# Patient Record
Sex: Male | Born: 1985 | Hispanic: No | Marital: Married | Smoking: Never smoker
Health system: Southern US, Community
[De-identification: ages and names within clinical notes are randomized; demographics above are authoritative.]

## PROBLEM LIST (undated history)

## (undated) DIAGNOSIS — G43909 Migraine, unspecified, not intractable, without status migrainosus: Secondary | ICD-10-CM

## (undated) HISTORY — PX: MOLE REMOVAL: SHX2046

## (undated) HISTORY — DX: Migraine, unspecified, not intractable, without status migrainosus: G43.909

---
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
## 2012-11-07 ENCOUNTER — Encounter: Payer: Self-pay | Admitting: Family Medicine

## 2012-11-07 ENCOUNTER — Ambulatory Visit (INDEPENDENT_AMBULATORY_CARE_PROVIDER_SITE_OTHER): Payer: 59 | Admitting: Family Medicine

## 2012-11-07 VITALS — BP 112/78 | HR 52 | Temp 98.4°F | Resp 16 | Ht 69.5 in | Wt 180.5 lb

## 2012-11-07 DIAGNOSIS — Z0000 Encounter for general adult medical examination without abnormal findings: Secondary | ICD-10-CM

## 2012-11-07 LAB — BASIC METABOLIC PANEL
BUN: 17 mg/dL (ref 6–23)
Calcium: 9.7 mg/dL (ref 8.4–10.5)
Creatinine, Ser: 1 mg/dL (ref 0.4–1.5)
GFR: 94.21 mL/min (ref >60.00)
Glucose, Bld: 98 mg/dL (ref 70–99)

## 2012-11-07 LAB — CBC WITH DIFFERENTIAL/PLATELET
Basophils Relative: 0.2 % (ref 0.0–3.0)
Eosinophils Relative: 3.6 % (ref 0.0–5.0)
HCT: 44.4 % (ref 39.0–52.0)
Hemoglobin: 14.9 g/dL (ref 13.0–17.0)
Lymphs Abs: 1.8 10*3/uL (ref 0.7–4.0)
MCV: 87.1 fl (ref 78.0–100.0)
Monocytes Absolute: 0.6 10*3/uL (ref 0.1–1.0)
Neutrophils Relative %: 58.3 % (ref 43.0–77.0)
RBC: 5.09 Mil/uL (ref 4.22–5.81)
WBC: 6.2 10*3/uL (ref 4.5–10.5)

## 2012-11-07 LAB — HEPATIC FUNCTION PANEL
ALT: 21 U/L (ref 0–53)
AST: 28 U/L (ref 0–37)
Total Bilirubin: 0.4 mg/dL (ref 0.3–1.2)

## 2012-11-07 LAB — POCT URINALYSIS DIPSTICK
Ketones, UA: NEGATIVE
Leukocytes, UA: NEGATIVE
Protein, UA: NEGATIVE
Spec Grav, UA: 1.01
pH, UA: 5.5

## 2012-11-07 NOTE — Progress Notes (Signed)
  Subjective:    Patient ID: Luke Gallagher, male    DOB: [birth year], 27 y.o.   MRN: [id number]  HPI Patient seen to establish care and for well visit No chronic medical problems. Takes no regular medications. Only previous surgery was for wisdom teeth extraction.  Family history significant for father dying of bladder cancer age 19. Grandfather with melanoma history. Father also had history of stroke.  Patient is married. Expecting first child in January of 2015. Nonsmoker. No alcohol use. Exercises regularly. Currently taking online Masters course. Works as a Geneticist, molecular.  No past medical history on file. Past Surgical History  Procedure Laterality Date  . Wisdom tooth extraction  2004  . Mole removal      reports that he has never smoked. He does not have any smokeless tobacco history on file. He reports that he does not drink alcohol or use illicit drugs. family history includes Bladder Cancer (age of onset: 32) in his father; Breast cancer in his paternal grandmother; Colon cancer in his maternal grandmother; Healthy in his brother and mother; Hyperlipidemia in his father; Hypertension in his father; Melanoma in his paternal grandfather; Other in his maternal grandfather; and Stroke in his father. No Known Allergies    Review of Systems  Constitutional: Negative for fever, activity change, appetite change and fatigue.  HENT: Negative for ear pain, congestion and trouble swallowing.   Eyes: Negative for pain and visual disturbance.  Respiratory: Negative for cough, shortness of breath and wheezing.   Cardiovascular: Negative for chest pain and palpitations.  Gastrointestinal: Negative for nausea, vomiting, abdominal pain, diarrhea, constipation, blood in stool, abdominal distention and rectal pain.  Genitourinary: Negative for dysuria, hematuria and testicular pain.  Musculoskeletal: Negative for joint swelling and arthralgias.  Skin: Negative for rash.   Neurological: Negative for dizziness, syncope and headaches.  Hematological: Negative for adenopathy.  Psychiatric/Behavioral: Negative for confusion and dysphoric mood.       Objective:   Physical Exam  Constitutional: He is oriented to person, place, and time. He appears well-developed and well-nourished. No distress.  HENT:  Head: Normocephalic and atraumatic.  Right Ear: External ear normal.  Left Ear: External ear normal.  Mouth/Throat: Oropharynx is clear and moist.  Eyes: Conjunctivae and EOM are normal. Pupils are equal, round, and reactive to light.  Neck: Normal range of motion. Neck supple. No thyromegaly present.  Cardiovascular: Normal rate, regular rhythm and normal heart sounds.   No murmur heard. Pulmonary/Chest: No respiratory distress. He has no wheezes. He has no rales.  Abdominal: Soft. Bowel sounds are normal. He exhibits no distension and no mass. There is no tenderness. There is no rebound and no guarding.  Musculoskeletal: He exhibits no edema.  Lymphadenopathy:    He has no cervical adenopathy.  Neurological: He is alert and oriented to person, place, and time. He displays normal reflexes. No cranial nerve deficit.  Skin: No rash noted.  Patient has some scattered nevi but these appear benign  Psychiatric: He has a normal mood and affect.          Assessment & Plan:  Healthy 27 year old male. Confirm date of last tetanus and recommend booster if this was done before 2006. Obtain screening lab work. Continue regular exercise habits.

## 2012-11-07 NOTE — Patient Instructions (Addendum)
Confirm date of last tetanus and consider Tdap if not received after 2006.

## 2013-05-03 ENCOUNTER — Telehealth: Payer: Self-pay | Admitting: Internal Medicine

## 2013-05-03 NOTE — Telephone Encounter (Signed)
Patient Information:  Caller Name: Revonda Standard  Phone: [phone]  Patient: Luke Gallagher, Luke Gallagher  Gender: Male  DOB: 1986/02/06  Age: 27 Years  PCP: Birdie Sons (Adults only)  Office Follow Up:  Does the office need to follow up with this patient?: No  Instructions For The Office: N/A   Symptoms  Reason For Call & Symptoms: Allsion wife calling for husband who had cold SX and she wanted to know how long he would be contagious.  I told her until he has not run a fever x 24 hrs without antipyretics.  Reviewed Health History In EMR: N/A  Reviewed Medications In EMR: N/A  Reviewed Allergies In EMR: N/A  Reviewed Surgeries / Procedures: N/A  Date of Onset of Symptoms: Unknown  Guideline(s) Used:  No Protocol Available - Information Only  Disposition Per Guideline:   Home Care  Reason For Disposition Reached:   Information only question and nurse able to answer  Advice Given:  N/A  Patient Will Follow Care Advice:  YES

## 2014-02-03 ENCOUNTER — Encounter: Payer: 59 | Admitting: Family Medicine

## 2014-02-26 ENCOUNTER — Encounter: Payer: Self-pay | Admitting: Family Medicine

## 2014-02-26 ENCOUNTER — Ambulatory Visit (INDEPENDENT_AMBULATORY_CARE_PROVIDER_SITE_OTHER): Payer: 59 | Admitting: Family Medicine

## 2014-02-26 VITALS — BP 122/74 | HR 111 | Temp 98.4°F | Wt 182.0 lb

## 2014-02-26 DIAGNOSIS — R05 Cough: Secondary | ICD-10-CM

## 2014-02-26 DIAGNOSIS — R059 Cough, unspecified: Secondary | ICD-10-CM

## 2014-02-26 LAB — CBC WITH DIFFERENTIAL/PLATELET
BASOS PCT: 0.1 % (ref 0.0–3.0)
Basophils Absolute: 0 10*3/uL (ref 0.0–0.1)
EOS PCT: 0.2 % (ref 0.0–5.0)
Eosinophils Absolute: 0 10*3/uL (ref 0.0–0.7)
HCT: 45.2 % (ref 39.0–52.0)
HEMOGLOBIN: 15 g/dL (ref 13.0–17.0)
LYMPHS PCT: 9.2 % — AB (ref 12.0–46.0)
Lymphs Abs: 0.7 10*3/uL (ref 0.7–4.0)
MCHC: 33.2 g/dL (ref 30.0–36.0)
MCV: 85.2 fl (ref 78.0–100.0)
MONOS PCT: 6 % (ref 3.0–12.0)
Monocytes Absolute: 0.5 10*3/uL (ref 0.1–1.0)
NEUTROS ABS: 6.6 10*3/uL (ref 1.4–7.7)
NEUTROS PCT: 84.5 % — AB (ref 43.0–77.0)
Platelets: 268 10*3/uL (ref 150.0–400.0)
RBC: 5.3 Mil/uL (ref 4.22–5.81)
RDW: 13.2 % (ref 11.5–15.5)
WBC: 7.8 10*3/uL (ref 4.0–10.5)

## 2014-02-26 NOTE — Patient Instructions (Signed)
Follow up for any fever or increased shortness of breath. 

## 2014-02-26 NOTE — Progress Notes (Signed)
Pre visit review using our clinic review tool, if applicable. No additional management support is needed unless otherwise documented below in the visit note. 

## 2014-02-26 NOTE — Progress Notes (Signed)
   Subjective:    Patient ID: Luke Gallagher, male    DOB: [birth year], 28 y.o.   MRN: [id number]  HPI Patient seen for acute visit with fatigue and cough.  .  2 weeks ago he had typical head cold and seemed to be getting better until a few days ago. He had recurrent cough which is been mostly nonproductive. Does have some chills and night sweats but has not taken temperature. Has had some nasal congestion intermittent sore throat.  Decreased appetite. No nausea or vomiting. No dysuria. No skin rashes. Diffuse body aches  No past medical history on file. Past Surgical History  Procedure Laterality Date  . Wisdom tooth extraction  2004  . Mole removal      reports that he has never smoked. He does not have any smokeless tobacco history on file. He reports that he does not drink alcohol or use illicit drugs. family history includes Bladder Cancer (age of onset: 7353) in his father; Breast cancer in his paternal grandmother; Colon cancer in his maternal grandmother; Healthy in his brother and mother; Hyperlipidemia in his father; Hypertension in his father; Melanoma in his paternal grandfather; Other in his maternal grandfather; Stroke in his father. No Known Allergies    Review of Systems  Constitutional: Positive for chills and fatigue.  HENT: Positive for congestion and sore throat.   Respiratory: Positive for cough.   Gastrointestinal: Negative for abdominal pain.  Genitourinary: Negative for dysuria.  Skin: Negative for rash.  Hematological: Negative for adenopathy.       Objective:   Physical Exam  Constitutional: He appears well-developed and well-nourished.  HENT:  Right Ear: External ear normal.  Left Ear: External ear normal.  Mouth/Throat: Oropharynx is clear and moist.  Neck: Neck supple.  Cardiovascular: Normal rate and regular rhythm.   Pulmonary/Chest: Effort normal and breath sounds normal. No respiratory distress. He has no wheezes. He has no rales.  Musculoskeletal:  He exhibits no edema.  Lymphadenopathy:    He has no cervical adenopathy.  Skin: No rash noted.          Assessment & Plan:   probable viral syndrome. Patient had what sounds like a viral URI couple weeks ago. Nonfocal exam this time. Check temperature to document any fever. Check CBC. Consider chest x-ray if cough persist and fever documented, though nonfocal exam at this time

## 2014-03-03 ENCOUNTER — Encounter: Payer: 59 | Admitting: Family Medicine

## 2014-05-04 ENCOUNTER — Emergency Department (HOSPITAL_COMMUNITY)
Admission: EM | Admit: 2014-05-04 | Discharge: 2014-05-04 | Disposition: A | Discharge status: Home / Self Care | Payer: 59 | Source: Home / Self Care | Attending: Family Medicine | Admitting: Family Medicine

## 2014-05-04 ENCOUNTER — Encounter (HOSPITAL_COMMUNITY): Payer: Self-pay | Admitting: Emergency Medicine

## 2014-05-04 ENCOUNTER — Emergency Department (HOSPITAL_COMMUNITY)
Admission: EM | Admit: 2014-05-04 | Discharge: 2014-05-04 | Disposition: A | Discharge status: Home / Self Care | Payer: 59 | Attending: Emergency Medicine | Admitting: Emergency Medicine

## 2014-05-04 ENCOUNTER — Emergency Department (HOSPITAL_COMMUNITY): Payer: 59

## 2014-05-04 DIAGNOSIS — R51 Headache: Secondary | ICD-10-CM

## 2014-05-04 DIAGNOSIS — R519 Headache, unspecified: Secondary | ICD-10-CM

## 2014-05-04 DIAGNOSIS — R1110 Vomiting, unspecified: Secondary | ICD-10-CM | POA: Diagnosis not present

## 2014-05-04 DIAGNOSIS — R109 Unspecified abdominal pain: Secondary | ICD-10-CM | POA: Insufficient documentation

## 2014-05-04 DIAGNOSIS — Z79899 Other long term (current) drug therapy: Secondary | ICD-10-CM | POA: Insufficient documentation

## 2014-05-04 LAB — CBC WITH DIFFERENTIAL/PLATELET
Basophils Absolute: 0 10*3/uL (ref 0.0–0.1)
Basophils Relative: 0 % (ref 0–1)
Eosinophils Absolute: 0 10*3/uL (ref 0.0–0.7)
Eosinophils Relative: 0 % (ref 0–5)
HCT: 44 % (ref 39.0–52.0)
Hemoglobin: 15.1 g/dL (ref 13.0–17.0)
LYMPHS PCT: 9 % — AB (ref 12–46)
Lymphs Abs: 1 10*3/uL (ref 0.7–4.0)
MCH: 28.5 pg (ref 26.0–34.0)
MCHC: 34.3 g/dL (ref 30.0–36.0)
MCV: 83.2 fL (ref 78.0–100.0)
Monocytes Absolute: 0.6 10*3/uL (ref 0.1–1.0)
Monocytes Relative: 5 % (ref 3–12)
NEUTROS ABS: 9.5 10*3/uL — AB (ref 1.7–7.7)
NEUTROS PCT: 86 % — AB (ref 43–77)
Platelets: 279 10*3/uL (ref 150–400)
RBC: 5.29 MIL/uL (ref 4.22–5.81)
RDW: 12.8 % (ref 11.5–15.5)
WBC: 11.1 10*3/uL — AB (ref 4.0–10.5)

## 2014-05-04 LAB — COMPREHENSIVE METABOLIC PANEL
ALT: 18 U/L (ref 0–53)
AST: 22 U/L (ref 0–37)
Albumin: 4.7 g/dL (ref 3.5–5.2)
Alkaline Phosphatase: 75 U/L (ref 39–117)
Anion gap: 16 — ABNORMAL HIGH (ref 5–15)
BILIRUBIN TOTAL: 0.5 mg/dL (ref 0.3–1.2)
BUN: 14 mg/dL (ref 6–23)
CHLORIDE: 99 meq/L (ref 96–112)
CO2: 25 meq/L (ref 19–32)
Calcium: 9.7 mg/dL (ref 8.4–10.5)
Creatinine, Ser: 0.89 mg/dL (ref 0.50–1.35)
GFR calc Af Amer: >90 mL/min (ref >90)
Glucose, Bld: 106 mg/dL — ABNORMAL HIGH (ref 70–99)
Potassium: 4.3 mEq/L (ref 3.7–5.3)
SODIUM: 140 meq/L (ref 137–147)
Total Protein: 8.1 g/dL (ref 6.0–8.3)

## 2014-05-04 MED ORDER — KETOROLAC TROMETHAMINE 30 MG/ML IJ SOLN
30.0000 mg | Freq: Once | INTRAMUSCULAR | Status: AC
  Administered 2014-05-04: 30 mg via INTRAVENOUS
  Filled 2014-05-04: qty 1

## 2014-05-04 MED ORDER — PROMETHAZINE HCL 25 MG PO TABS: 25.0000 mg | ORAL_TABLET | Freq: Four times a day (QID) | ORAL | Status: DC | PRN

## 2014-05-04 MED ORDER — ONDANSETRON HCL 4 MG/2ML IJ SOLN
4.0000 mg | Freq: Once | INTRAMUSCULAR | Status: DC
  Filled 2014-05-04: qty 2

## 2014-05-04 MED ORDER — ONDANSETRON HCL 4 MG/2ML IJ SOLN
4.0000 mg | Freq: Once | INTRAMUSCULAR | Status: AC
  Administered 2014-05-04: 4 mg via INTRAVENOUS

## 2014-05-04 MED ORDER — SODIUM CHLORIDE 0.9 % IV SOLN
Freq: Once | INTRAVENOUS | Status: AC
  Administered 2014-05-04: 15:00:00 via INTRAVENOUS

## 2014-05-04 MED ORDER — HYDROMORPHONE HCL 1 MG/ML IJ SOLN
1.0000 mg | Freq: Once | INTRAMUSCULAR | Status: AC
Start: 2014-05-04 — End: 2014-05-04
  Administered 2014-05-04: 1 mg via INTRAVENOUS
  Filled 2014-05-04: qty 1

## 2014-05-04 MED ORDER — SODIUM CHLORIDE 0.9 % IV SOLN
Freq: Once | INTRAVENOUS | Status: AC
Start: 2014-05-04 — End: 2014-05-04
  Administered 2014-05-04: 11:00:00 via INTRAVENOUS

## 2014-05-04 MED ORDER — HYDROCODONE-ACETAMINOPHEN 5-325 MG PO TABS: 1.0000 | ORAL_TABLET | ORAL | Status: DC | PRN

## 2014-05-04 NOTE — ED Provider Notes (Signed)
CSN: [id number]     Arrival date & time 05/04/14  [id number] History   First MD Initiated Contact with Patient 05/04/14 [phone]     Chief Complaint  Patient presents with  . Headache   (Consider location/radiation/quality/duration/timing/severity/associated sxs/prior Treatment) Patient is a 28 y.o. male presenting with headaches. The history is provided by the patient.  Headache Pain location:  Generalized Quality:  Sharp Radiates to:  Does not radiate Onset quality:  Sudden Duration:  2 days Progression:  Worsening Chronicity:  New Similar to prior headaches: no   Worsened by:  Nothing tried Ineffective treatments:  Acetaminophen Associated symptoms: nausea   Associated symptoms: no back pain, no blurred vision, no congestion, no diarrhea, no dizziness, no ear pain, no pain, no neck pain, no neck stiffness, no numbness, no photophobia, no sinus pressure, no sore throat and no vomiting     History reviewed. No pertinent past medical history. Past Surgical History  Procedure Laterality Date  . Wisdom tooth extraction  2004  . Mole removal     Family History  Problem Relation Age of Onset  . Bladder Cancer Father 5353    Deceased  . Hyperlipidemia Father   . Hypertension Father   . Stroke Father   . Healthy Mother   . Colon cancer Maternal Grandmother   . Breast cancer Paternal Grandmother   . Melanoma Paternal Grandfather   . Other Maternal Grandfather     Natural causes-Age  . Healthy Brother    History  Substance Use Topics  . Smoking status: Never Smoker   . Smokeless tobacco: Not on file  . Alcohol Use: No    Review of Systems  Constitutional: Negative.   HENT: Negative for congestion, ear pain, sinus pressure and sore throat.   Eyes: Negative for blurred vision, photophobia and pain.  Gastrointestinal: Positive for nausea. Negative for vomiting and diarrhea.  Musculoskeletal: Negative for back pain, gait problem, neck pain and neck stiffness.  Neurological:  Positive for headaches. Negative for dizziness, weakness, light-headedness and numbness.    Allergies  Review of patient's allergies indicates no known allergies.  Home Medications   Prior to Admission medications   Medication Sig Start Date End Date Taking? Authorizing Provider  fish oil-omega-3 fatty acids 1000 MG capsule Take 2 g by mouth daily.    Historical Provider, MD  Multiple Vitamin (MULTIVITAMIN) tablet Take 1 tablet by mouth daily.    Historical Provider, MD   There were no vitals taken for this visit. Physical Exam  Constitutional: He is oriented to person, place, and time. He appears well-developed and well-nourished.  HENT:  Head: Normocephalic.  Right Ear: External ear normal.  Left Ear: External ear normal.  Mouth/Throat: Oropharynx is clear and moist.  Eyes: Pupils are equal, round, and reactive to light.  Neck: Normal range of motion. Neck supple.  Cardiovascular: Regular rhythm and normal heart sounds.   Pulmonary/Chest: Effort normal and breath sounds normal.  Abdominal: Soft. Bowel sounds are normal. He exhibits no distension. There is no tenderness.  Lymphadenopathy:    He has no cervical adenopathy.  Neurological: He is alert and oriented to person, place, and time. No cranial nerve deficit. Coordination normal.  Skin: Skin is warm and dry.  Nursing note and vitals reviewed.   ED Course  Procedures (including critical care time) Labs Review Labs Reviewed - No data to display  Imaging Review No results found.   MDM   1. Headache disorder   sent for further eval of  worst ever ha, no h/o ha, no fever, photophobia, sl nausea, mod stress, unable to sleep b/o ha.    Linna HoffJames D Radonna Bracher, MD 05/04/14 1003

## 2014-05-04 NOTE — ED Notes (Signed)
I gave the patient a cup of ice water and a container of apple juice per nurse Marcelino DusterMichelle.

## 2014-05-04 NOTE — ED Notes (Signed)
C/o  Severe headache.  On set Saturday morning.  States "nausea started early this a.m."   No relief with otc meds.    Pain is felt at the back and top of head.   Also pain behind the eyes.   Denies any other symptoms.

## 2014-05-04 NOTE — ED Notes (Signed)
Patient transported to CT 

## 2014-05-04 NOTE — ED Provider Notes (Signed)
CSN: [id number]     Arrival date & time 05/04/14  1009 History   First MD Initiated Contact with Patient 05/04/14 1037     No chief complaint on file.    (Consider location/radiation/quality/duration/timing/severity/associated sxs/prior Treatment) Patient is a 28 y.o. male presenting with headaches. The history is provided by the patient. No language interpreter was used.  Headache Pain location:  Generalized Radiates to:  Does not radiate Severity currently:  10/10 Severity at highest:  10/10 Onset quality:  Sudden Duration:  1 day Timing:  Constant Progression:  Worsening Chronicity:  New Similar to prior headaches: no   Context: not straining   Relieved by:  Nothing Worsened by:  Nothing tried Ineffective treatments:  None tried Associated symptoms: abdominal pain and vomiting   Associated symptoms: no numbness   Risk factors: no anger and does not have insomnia     History reviewed. No pertinent past medical history. Past Surgical History  Procedure Laterality Date  . Wisdom tooth extraction  2004  . Mole removal     Family History  Problem Relation Age of Onset  . Bladder Cancer Father 2653    Deceased  . Hyperlipidemia Father   . Hypertension Father   . Stroke Father   . Healthy Mother   . Colon cancer Maternal Grandmother   . Breast cancer Paternal Grandmother   . Melanoma Paternal Grandfather   . Other Maternal Grandfather     Natural causes-Age  . Healthy Brother    History  Substance Use Topics  . Smoking status: Never Smoker   . Smokeless tobacco: Not on file  . Alcohol Use: No    Review of Systems  Gastrointestinal: Positive for vomiting and abdominal pain.  Neurological: Positive for headaches. Negative for numbness.  All other systems reviewed and are negative.     Allergies  Review of patient's allergies indicates no known allergies.  Home Medications   Prior to Admission medications   Medication Sig Start Date End Date Taking?  Authorizing Provider  aspirin-acetaminophen-caffeine (EXCEDRIN MIGRAINE) [phone] MG per tablet Take 2 tablets by mouth every 6 (six) hours as needed for headache.   Yes Historical Provider, MD  butalbital-acetaminophen-caffeine (FIORICET, ESGIC) 50-325-40 MG per tablet Take 2 tablets by mouth 2 (two) times daily as needed for headache or migraine.   Yes Historical Provider, MD  fish oil-omega-3 fatty acids 1000 MG capsule Take 2 g by mouth daily.   Yes Historical Provider, MD  Multiple Vitamin (MULTIVITAMIN) tablet Take 1 tablet by mouth daily.   Yes Historical Provider, MD   BP 117/63 mmHg  Pulse 67  Temp(Src) 98.2 F (36.8 C) (Oral)  Resp 11  SpO2 100% Physical Exam  Constitutional: He is oriented to person, place, and time. He appears well-developed and well-nourished.  HENT:  Head: Normocephalic and atraumatic.  Right Ear: External ear normal.  Nose: Nose normal.  Mouth/Throat: Oropharynx is clear and moist.  Eyes: Conjunctivae and EOM are normal. Pupils are equal, round, and reactive to light.  Neck: Normal range of motion.  Cardiovascular: Normal rate and normal heart sounds.   Pulmonary/Chest: Effort normal.  Abdominal: Soft. He exhibits no distension.  Musculoskeletal: Normal range of motion.  Neurological: He is alert and oriented to person, place, and time.  Skin: Skin is warm.  Psychiatric: He has a normal mood and affect.  Nursing note and vitals reviewed.   ED Course  Procedures (including critical care time) Labs Review Labs Reviewed - No data to display  Imaging Review No results found.   EKG Interpretation None      Results for orders placed or performed during the hospital encounter of 05/04/14  CBC with Differential  Result Value Ref Range   WBC 11.1 (H) 4.0 - 10.5 K/uL   RBC 5.29 4.22 - 5.81 MIL/uL   Hemoglobin 15.1 13.0 - 17.0 g/dL   HCT 40.944.0 81.139.0 - 91.452.0 %   MCV 83.2 78.0 - 100.0 fL   MCH 28.5 26.0 - 34.0 pg   MCHC 34.3 30.0 - 36.0 g/dL    RDW 78.212.8 95.611.5 - 21.315.5 %   Platelets 279 150 - 400 K/uL   Neutrophils Relative % 86 (H) 43 - 77 %   Neutro Abs 9.5 (H) 1.7 - 7.7 K/uL   Lymphocytes Relative 9 (L) 12 - 46 %   Lymphs Abs 1.0 0.7 - 4.0 K/uL   Monocytes Relative 5 3 - 12 %   Monocytes Absolute 0.6 0.1 - 1.0 K/uL   Eosinophils Relative 0 0 - 5 %   Eosinophils Absolute 0.0 0.0 - 0.7 K/uL   Basophils Relative 0 0 - 1 %   Basophils Absolute 0.0 0.0 - 0.1 K/uL  Comprehensive metabolic panel  Result Value Ref Range   Sodium 140 137 - 147 mEq/L   Potassium 4.3 3.7 - 5.3 mEq/L   Chloride 99 96 - 112 mEq/L   CO2 25 19 - 32 mEq/L   Glucose, Bld 106 (H) 70 - 99 mg/dL   BUN 14 6 - 23 mg/dL   Creatinine, Ser 0.860.89 0.50 - 1.35 mg/dL   Calcium 9.7 8.4 - 57.810.5 mg/dL   Total Protein 8.1 6.0 - 8.3 g/dL   Albumin 4.7 3.5 - 5.2 g/dL   AST 22 0 - 37 U/L   ALT 18 0 - 53 U/L   Alkaline Phosphatase 75 39 - 117 U/L   Total Bilirubin 0.5 0.3 - 1.2 mg/dL   GFR calc non Af Amer >90 >90 mL/min   GFR calc Af Amer >90 >90 mL/min   Anion gap 16 (H) 5 - 15   Ct Head Wo Contrast  05/04/2014   CLINICAL DATA:  Patient with severe headache.  Nausea.  EXAM: CT HEAD WITHOUT CONTRAST  TECHNIQUE: Contiguous axial images were obtained from the base of the skull through the vertex without intravenous contrast.  COMPARISON:  None.  FINDINGS: Ventricles and sulci are appropriate for patient's age. No evidence for acute cortically based infarct, intracranial hemorrhage, mass lesion or mass-effect. The orbits are unremarkable. Paranasal sinuses are well aerated. Mastoid air cells are unremarkable. Calvarium is intact.  IMPRESSION: No acute intracranial process.   Electronically Signed   By: Annia Beltrew  Davis M.D.   On: 05/04/2014 14:17     MDM  Pt given iv fluids,  zofran and dilaudidx 1.   Ct scan normal.   Pt given torodol and zofran.  Pt feels better   Final diagnoses:  Head ache    Hydrocodone Phenergan     Elson AreasLeslie K Sofia, PA-C 05/04/14 1543  Rolland PorterMark  James, MD 05/05/14 2342

## 2014-05-04 NOTE — Discharge Instructions (Signed)

## 2014-05-04 NOTE — ED Notes (Signed)
Unbearable headache since yesterday morning. Entire head is experiencing pressure and throbbing. Throbbing and pressure increase with position changes such as bending over. Nausea this morning, no vomiting. Sensitive to sound, not light. Denies weakness in extremities, vision changes or changes in balance. Mild lightheaded feeling. Tried Exedrin, Baristaiorocet (given by a family member), and Nyquil without any relief.

## 2014-05-04 NOTE — ED Notes (Signed)
Patient returned from CT

## 2014-12-18 ENCOUNTER — Encounter: Payer: Self-pay | Admitting: Family Medicine

## 2014-12-18 ENCOUNTER — Ambulatory Visit (INDEPENDENT_AMBULATORY_CARE_PROVIDER_SITE_OTHER): Payer: Commercial Managed Care - HMO | Admitting: Family Medicine

## 2014-12-18 VITALS — BP 110/62 | HR 60 | Temp 98.2°F | Wt 180.0 lb

## 2014-12-18 DIAGNOSIS — J029 Acute pharyngitis, unspecified: Secondary | ICD-10-CM | POA: Diagnosis not present

## 2014-12-18 LAB — POCT RAPID STREP A (OFFICE): RAPID STREP A SCREEN: NEGATIVE

## 2014-12-18 NOTE — Progress Notes (Signed)
   Subjective:    Patient ID: Luke Gallagher, male    DOB: 06/02/1985, 29 y.o.   MRN: [id number]  HPI Acute visit for sore throat. Duration 4 days. Patient had some chills and body aches and symptoms are actually improved today. He has not had any nausea or vomiting. No headaches. He has not had any cough or significant nasal congestion. Denies any sick contacts.  No past medical history on file. Past Surgical History  Procedure Laterality Date  . Wisdom tooth extraction  2004  . Mole removal      reports that he has never smoked. He does not have any smokeless tobacco history on file. He reports that he does not drink alcohol or use illicit drugs. family history includes Bladder Cancer (age of onset: 59) in his father; Breast cancer in his paternal grandmother; Colon cancer in his maternal grandmother; Healthy in his brother and mother; Hyperlipidemia in his father; Hypertension in his father; Melanoma in his paternal grandfather; Other in his maternal grandfather; Stroke in his father. No Known Allergies    Review of Systems  Constitutional: Negative for fever (Not now but possibly a few days ago).  HENT: Positive for sore throat. Negative for congestion.   Respiratory: Negative for cough.   Neurological: Negative for headaches.       Objective:   Physical Exam  Constitutional: He appears well-developed and well-nourished.  HENT:  Right Ear: External ear normal.  Left Ear: External ear normal.  Only very minimal erythema posterior pharynx. No exudate  Neck: Neck supple.  Cardiovascular: Normal rate and regular rhythm.   Pulmonary/Chest: Effort normal and breath sounds normal. No respiratory distress. He has no wheezes. He has no rales.  Lymphadenopathy:    He has no cervical adenopathy.          Assessment & Plan:  Pharyngitis. Rapid strep negative. Suspect viral. Treat symptomatically. Follow-up when necessary

## 2014-12-18 NOTE — Patient Instructions (Signed)

## 2014-12-18 NOTE — Progress Notes (Signed)
Pre visit review using our clinic review tool, if applicable. No additional management support is needed unless otherwise documented below in the visit note. 

## 2015-03-23 ENCOUNTER — Ambulatory Visit: Payer: Commercial Managed Care - HMO | Admitting: Family Medicine

## 2015-03-23 ENCOUNTER — Encounter: Payer: Self-pay | Admitting: Internal Medicine

## 2015-03-23 ENCOUNTER — Ambulatory Visit (INDEPENDENT_AMBULATORY_CARE_PROVIDER_SITE_OTHER): Payer: Commercial Managed Care - HMO | Admitting: Internal Medicine

## 2015-03-23 VITALS — BP 124/72 | Temp 98.9°F | Wt 176.0 lb

## 2015-03-23 DIAGNOSIS — J0110 Acute frontal sinusitis, unspecified: Secondary | ICD-10-CM

## 2015-03-23 MED ORDER — AMOXICILLIN-POT CLAVULANATE 875-125 MG PO TABS: 1.0000 | ORAL_TABLET | Freq: Two times a day (BID) | ORAL | Status: DC

## 2015-03-23 NOTE — Patient Instructions (Signed)
YOu have  Sinusitis probable bacterial complicaiton of  A viral respiratory infection . Antibiotic and continue  Saline spray and decongestant warm compresses. Expect improvement in the next 3-4 days  Contact us if not getting bette r   Sinusitis, Adult Sinusitis is redness, soreness, and inflammation of the paranasal sinuses. Paranasal sinuses are air pockets within the bones of your face. They are located beneath your eyes, in the middle of your forehead, and above your eyes. In healthy paranasal sinuses, mucus is able to drain out, and air is able to circulate through them by way of your nose. However, when your paranasal sinuses are inflamed, mucus and air can become trapped. This can allow bacteria and other germs to grow and cause infection. Sinusitis can develop quickly and last only a short time (acute) or continue over a long period (chronic). Sinusitis that lasts for more than 12 weeks is considered chronic. CAUSES Causes of sinusitis include:  Allergies.  Structural abnormalities, such as displacement of the cartilage that separates your nostrils (deviated septum), which can decrease the air flow through your nose and sinuses and affect sinus drainage.  Functional abnormalities, such as when the small hairs (cilia) that line your sinuses and help remove mucus do not work properly or are not present. SIGNS AND SYMPTOMS Symptoms of acute and chronic sinusitis are the same. The primary symptoms are pain and pressure around the affected sinuses. Other symptoms include:  Upper toothache.  Earache.  Headache.  Bad breath.  Decreased sense of smell and taste.  A cough, which worsens when you are lying flat.  Fatigue.  Fever.  Thick drainage from your nose, which often is green and may contain pus (purulent).  Swelling and warmth over the affected sinuses. DIAGNOSIS Your health care provider will perform a physical exam. During your exam, your health care provider may  perform any of the following to help determine if you have acute sinusitis or chronic sinusitis:  Look in your nose for signs of abnormal growths in your nostrils (nasal polyps).  Tap over the affected sinus to check for signs of infection.  View the inside of your sinuses using an imaging device that has a light attached (endoscope). If your health care provider suspects that you have chronic sinusitis, one or more of the following tests may be recommended:  Allergy tests.  Nasal culture. A sample of mucus is taken from your nose, sent to a lab, and screened for bacteria.  Nasal cytology. A sample of mucus is taken from your nose and examined by your health care provider to determine if your sinusitis is related to an allergy. TREATMENT Most cases of acute sinusitis are related to a viral infection and will resolve on their own within 10 days. Sometimes, medicines are prescribed to help relieve symptoms of both acute and chronic sinusitis. These may include pain medicines, decongestants, nasal steroid sprays, or saline sprays. However, for sinusitis related to a bacterial infection, your health care provider will prescribe antibiotic medicines. These are medicines that will help kill the bacteria causing the infection. Rarely, sinusitis is caused by a fungal infection. In these cases, your health care provider will prescribe antifungal medicine. For some cases of chronic sinusitis, surgery is needed. Generally, these are cases in which sinusitis recurs more than 3 times per year, despite other treatments. HOME CARE INSTRUCTIONS  Drink plenty of water. Water helps thin the mucus so your sinuses can drain more easily.  Use a humidifier.  Inhale steam  3-4 times a day (for example, sit in the bathroom with the shower running).  Apply a warm, moist washcloth to your face 3-4 times a day, or as directed by your health care provider.  Use saline nasal sprays to help moisten and clean your  sinuses.  Take medicines only as directed by your health care provider.  If you were prescribed either an antibiotic or antifungal medicine, finish it all even if you start to feel better. SEEK IMMEDIATE MEDICAL CARE IF:  You have increasing pain or severe headaches.  You have nausea, vomiting, or drowsiness.  You have swelling around your face.  You have vision problems.  You have a stiff neck.  You have difficulty breathing.   This information is not intended to replace advice given to you by your health care provider. Make sure you discuss any questions you have with your health care provider.   Document Released: 05/02/2005 Document Revised: 05/23/2014 Document Reviewed: 05/17/2011 Elsevier Interactive Patient Education [employer].

## 2015-03-23 NOTE — Progress Notes (Signed)
Pre visit review using our clinic review tool, if applicable. No additional management support is needed unless otherwise documented below in the visit note.  Chief Complaint  Patient presents with  . Fever  . Sore Throat  . Headache  . Cough  . Sinus Pressure/Pain    HPI: Patient Luke Gallagher  comes in today for SDA for  new problem evaluation. PCP is dr Caryl Never generally healthy   Onset a lmost a week ago  Onset runny nose and st .  nyquil .  Worse this weekend.   Now has had Fever. 3 days low grade 100 range .   Sweats  And bad pressure face   Ethmoid  Frontal   Area  Worse with  and  Saline. r tender   Frontal  Area .    Friday and jaw teeth cheek bone hurt .  Worse with bending. Over and when coughing   Cough ing up . Little bit yellow green .  Blowing nose  Yellow green.    ROS: See pertinent positives and negatives per HPI. No cp sob chills  Never had pain in face head like this in past . Using daquil niquil sudafed tec  No change  No vomiting diarrhea 29 yo at home had pink eye .  Wife expecting in march  No past medical history on file.  Family History  Problem Relation Age of Onset  . Bladder Cancer Father 17    Deceased  . Hyperlipidemia Father   . Hypertension Father   . Stroke Father   . Healthy Mother   . Colon cancer Maternal Grandmother   . Breast cancer Paternal Grandmother   . Melanoma Paternal Grandfather   . Other Maternal Grandfather     Natural causes-Age  . Healthy Brother     Social History   Social History  . Marital Status: Married    Spouse Name: N/A  . Number of Children: N/A  . Years of Education: N/A   Social History Main Topics  . Smoking status: Never Smoker   . Smokeless tobacco: None  . Alcohol Use: No  . Drug Use: No  . Sexual Activity: Yes   Other Topics Concern  . None   Social History Narrative    Outpatient Prescriptions Prior to Visit  Medication Sig Dispense Refill  . fish oil-omega-3 fatty acids 1000 MG  capsule Take 2 g by mouth daily.    . Multiple Vitamin (MULTIVITAMIN) tablet Take 1 tablet by mouth daily.    Marland Kitchen aspirin-acetaminophen-caffeine (EXCEDRIN MIGRAINE) 250-250-65 MG per tablet Take 2 tablets by mouth every 6 (six) hours as needed for headache.    . butalbital-acetaminophen-caffeine (FIORICET, ESGIC) 50-325-40 MG per tablet Take 2 tablets by mouth 2 (two) times daily as needed for headache or migraine.    . promethazine (PHENERGAN) 25 MG tablet Take 1 tablet (25 mg total) by mouth every 6 (six) hours as needed for nausea or vomiting. (Patient not taking: Reported on 03/23/2015) 20 tablet 0   No facility-administered medications prior to visit.     EXAM:  BP 124/72 mmHg  Temp(Src) 98.9 F (37.2 C) (Oral)  Wt 176 lb (79.833 kg)  Body mass index is 25.63 kg/(m^2).  GENERAL: vitals reviewed and listed above, alert, oriented, appears well hydrated and in no acute distress ild mod ill   Congested  HEENT: atraumatic, conjunctiva  clear, no obvious abnormalities on inspection of external nose and ears tms clear  Tenderness bilateral fontal ethmoid area  Mild on maxilla nares thick mucoid dc  OP : no lesion edema or exudate  NECK: no obvious masses on inspection palpation  LUNGS: clear to auscultation bilaterally, no wheezes, rales or rhonchi, good air movement CV: HRRR, no clubbing cyanosis or  peripheral edema nl cap refill  MS: moves all extremities without noticeable focal  Abnormality Skin: normal capillary refill ,turgor , color: No acute rashes ,petechiae or bruising PSYCH: pleasant and cooperative, no obvious depression or anxiety  ASSESSMENT AND PLAN:  Discussed the following assessment and plan:  Acute ? frontal sinusitis, recurrence not specified - sig sx unresponsive to decongestants and severity of sx  add antibiotic   Risk benefit of medication discussed.   Expectant management. Warm conpresses and add antibiotic as discussed  -Patient advised to return or notify  health care team  if symptoms worsen ,persist or new concerns arise.  Patient Instructions    YOu have  Sinusitis probable bacterial complicaiton of  A viral respiratory infection . Antibiotic and continue  Saline spray and decongestant warm compresses. Expect improvement in the next 3-4 days  Contact us if not getting bette r   Sinusitis, Adult Sinusitis is redness, soreness, and inflammation of the paranasal sinuses. Paranasal sinuses are air pockets within the bones of your face. They are located beneath your eyes, in the middle of your forehead, and above your eyes. In healthy paranasal sinuses, mucus is able to drain out, and air is able to circulate through them by way of your nose. However, when your paranasal sinuses are inflamed, mucus and air can become trapped. This can allow bacteria and other germs to grow and cause infection. Sinusitis can develop quickly and last only a short time (acute) or continue over a long period (chronic). Sinusitis that lasts for more than 12 weeks is considered chronic. CAUSES Causes of sinusitis include:  Allergies.  Structural abnormalities, such as displacement of the cartilage that separates your nostrils (deviated septum), which can decrease the air flow through your nose and sinuses and affect sinus drainage.  Functional abnormalities, such as when the small hairs (cilia) that line your sinuses and help remove mucus do not work properly or are not present. SIGNS AND SYMPTOMS Symptoms of acute and chronic sinusitis are the same. The primary symptoms are pain and pressure around the affected sinuses. Other symptoms include:  Upper toothache.  Earache.  Headache.  Bad breath.  Decreased sense of smell and taste.  A cough, which worsens when you are lying flat.  Fatigue.  Fever.  Thick drainage from your nose, which often is green and may contain pus (purulent).  Swelling and warmth over the affected sinuses. DIAGNOSIS Your  health care provider will perform a physical exam. During your exam, your health care provider may perform any of the following to help determine if you have acute sinusitis or chronic sinusitis:  Look in your nose for signs of abnormal growths in your nostrils (nasal polyps).  Tap over the affected sinus to check for signs of infection.  View the inside of your sinuses using an imaging device that has a light attached (endoscope). If your health care provider suspects that you have chronic sinusitis, one or more of the following tests may be recommended:  Allergy tests.  Nasal culture. A sample of mucus is taken from your nose, sent to a lab, and screened for bacteria.  Nasal cytology. A sample of mucus is taken from your nose and examined by your health care provider to  determine if your sinusitis is related to an allergy. TREATMENT Most cases of acute sinusitis are related to a viral infection and will resolve on their own within 10 days. Sometimes, medicines are prescribed to help relieve symptoms of both acute and chronic sinusitis. These may include pain medicines, decongestants, nasal steroid sprays, or saline sprays. However, for sinusitis related to a bacterial infection, your health care provider will prescribe antibiotic medicines. These are medicines that will help kill the bacteria causing the infection. Rarely, sinusitis is caused by a fungal infection. In these cases, your health care provider will prescribe antifungal medicine. For some cases of chronic sinusitis, surgery is needed. Generally, these are cases in which sinusitis recurs more than 3 times per year, despite other treatments. HOME CARE INSTRUCTIONS  Drink plenty of water. Water helps thin the mucus so your sinuses can drain more easily.  Use a humidifier.  Inhale steam 3-4 times a day (for example, sit in the bathroom with the shower running).  Apply a warm, moist washcloth to your face 3-4 times a day, or as  directed by your health care provider.  Use saline nasal sprays to help moisten and clean your sinuses.  Take medicines only as directed by your health care provider.  If you were prescribed either an antibiotic or antifungal medicine, finish it all even if you start to feel better. SEEK IMMEDIATE MEDICAL CARE IF:  You have increasing pain or severe headaches.  You have nausea, vomiting, or drowsiness.  You have swelling around your face.  You have vision problems.  You have a stiff neck.  You have difficulty breathing.   This information is not intended to replace advice given to you by your health care provider. Make sure you discuss any questions you have with your health care provider.   Document Released: 05/02/2005 Document Revised: 05/23/2014 Document Reviewed: 05/17/2011 Elsevier Interactive Patient Education 2016 [employer]      Timber LakeWanda K. Okley Magnussen M.D.

## 2015-07-03 IMAGING — CT CT HEAD W/O CM
1 series · 16 of 30 positions shown, 20 images · non-contrast
Comparison: None.

CLINICAL DATA: Patient with severe headache.  Nausea.

EXAM:
CT HEAD WITHOUT CONTRAST
TECHNIQUE: Contiguous axial images were obtained from the base of the skull
through the vertex without intravenous contrast.

[Series 2: head 5.0 h30s · axial · 0.44mm/px · z∈[+867,+1007]mm · 16 of 32 slices shown, 20 images]
[im 2/32  brain]
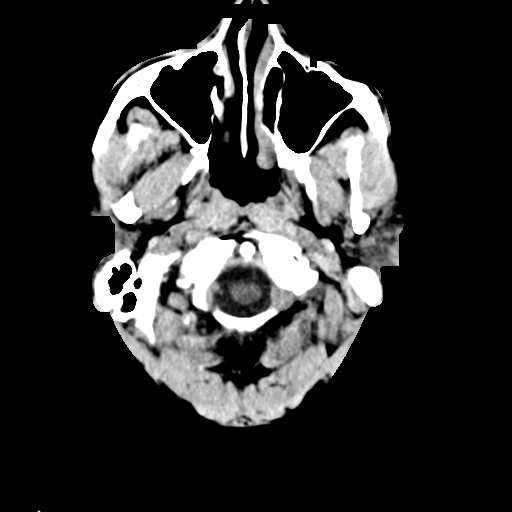
[im 2/32  bone]
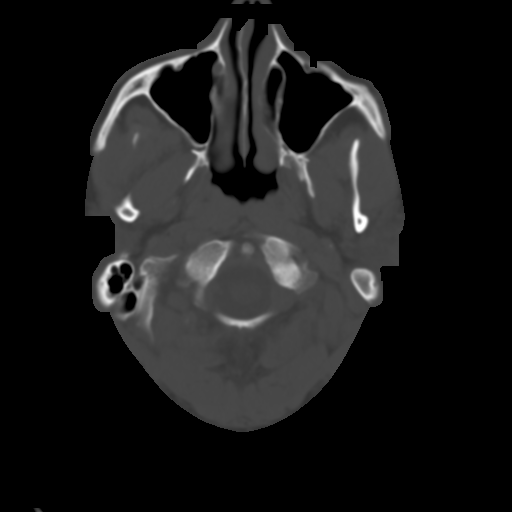
[im 4/32  brain]
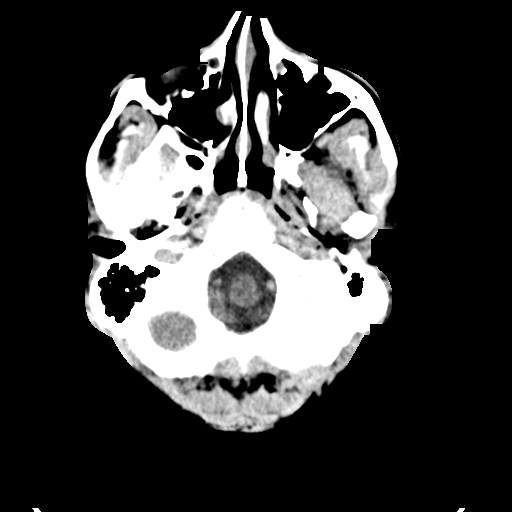
[im 6/32  brain]
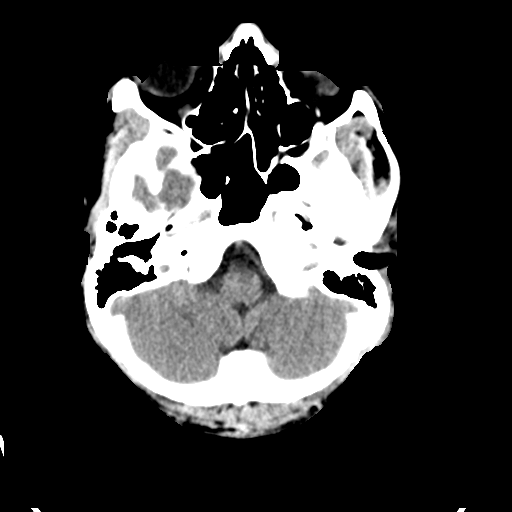
[im 8/32  brain]
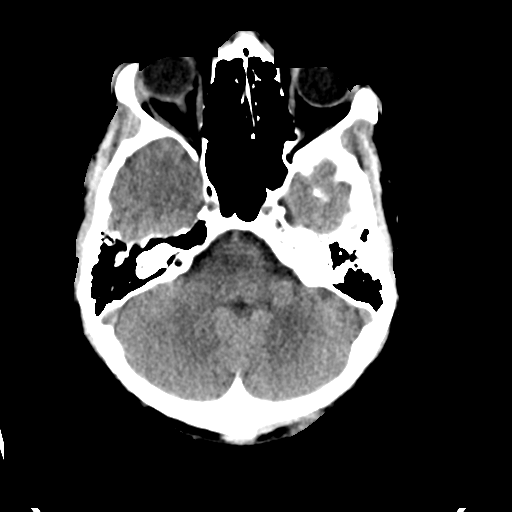
[im 9/32  brain]
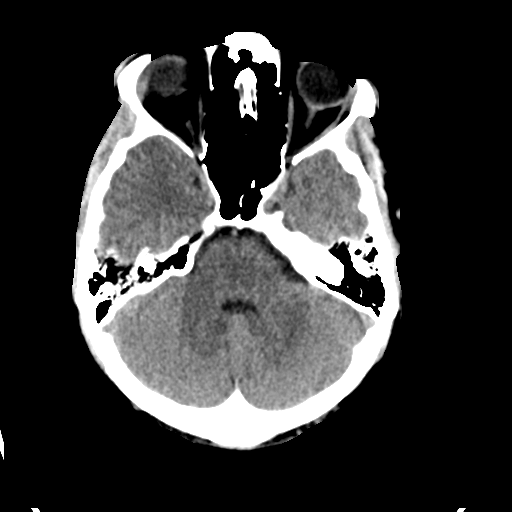
[im 9/32  bone]
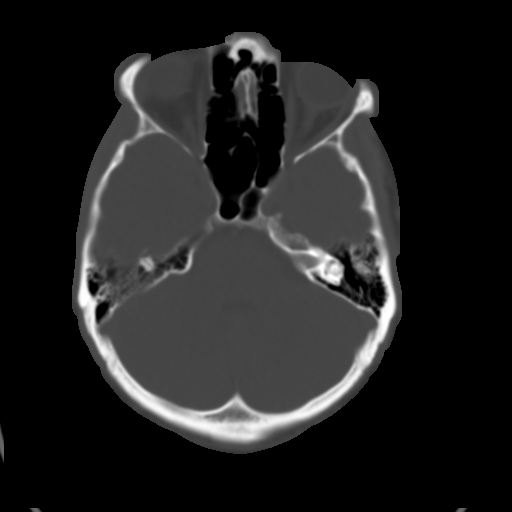
[im 11/32  brain]
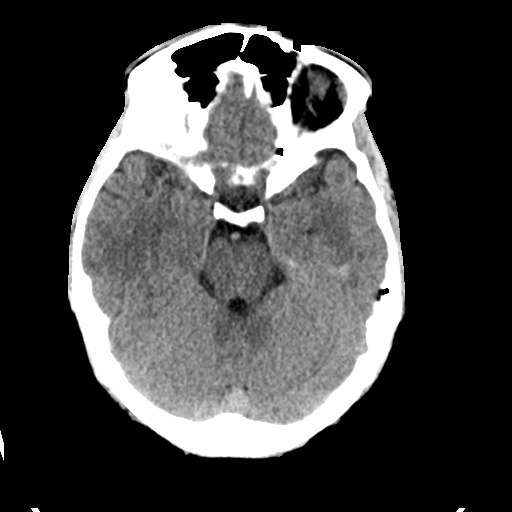
[im 13/32  brain]
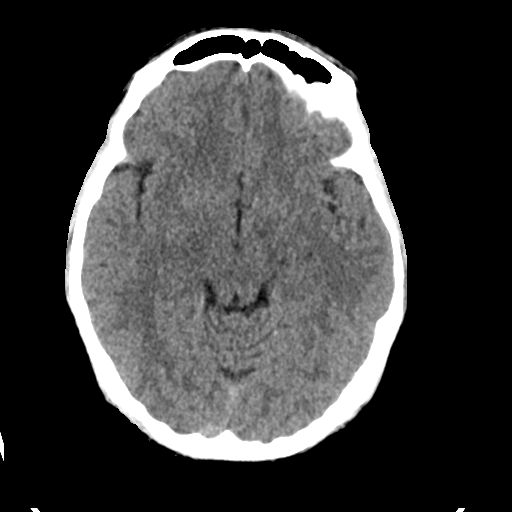
[im 15/32  brain]
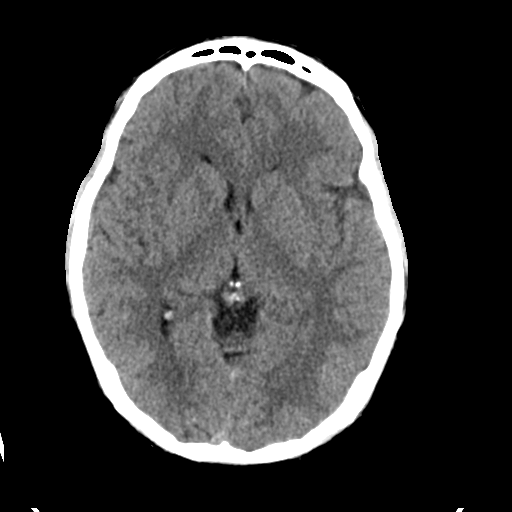
[im 17/32  brain]
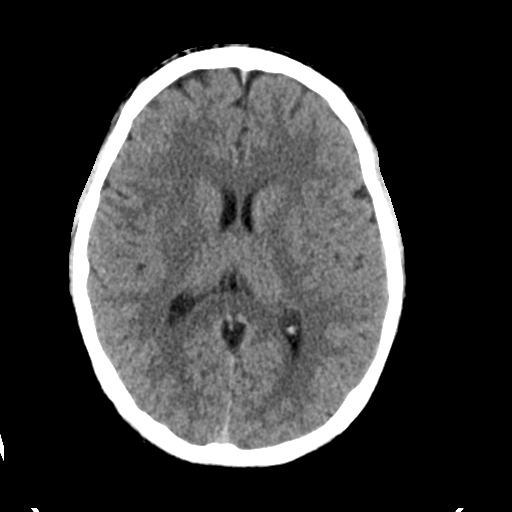
[im 17/32  bone]
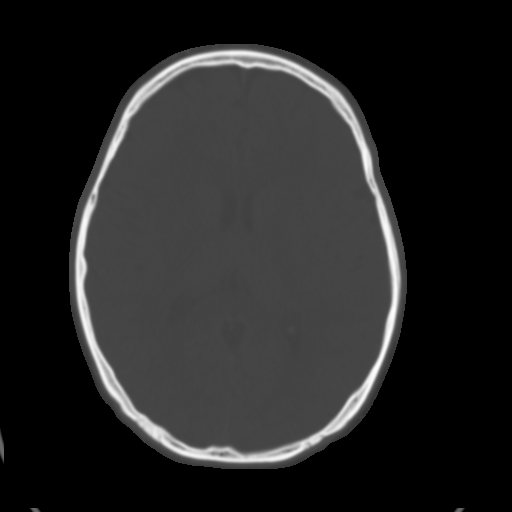
[im 19/32  brain]
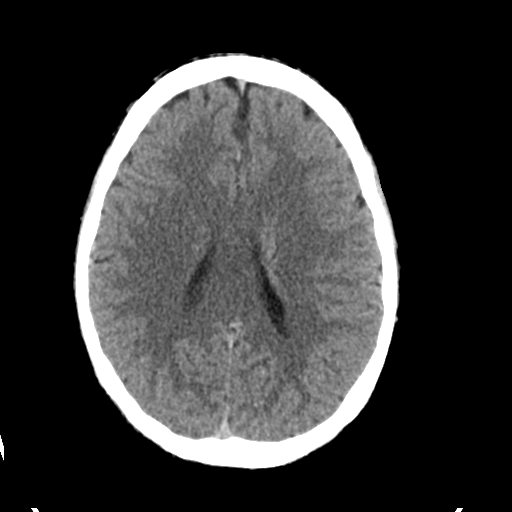
[im 21/32  brain]
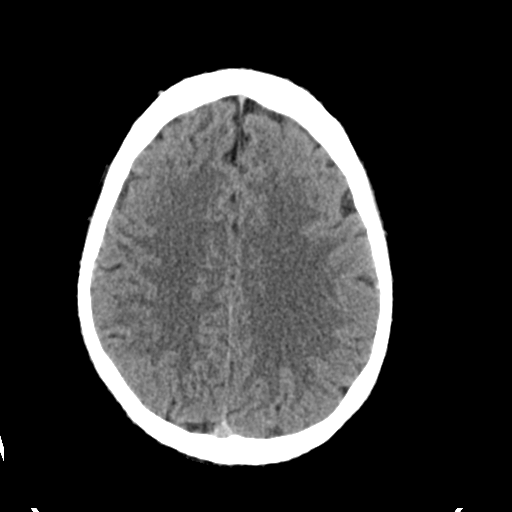
[im 23/32  brain]
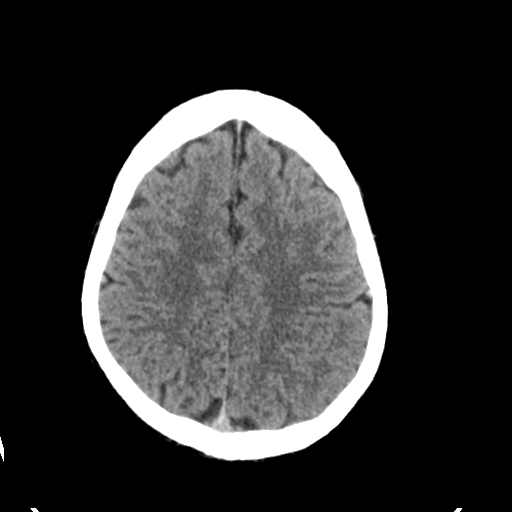
[im 24/32  brain]
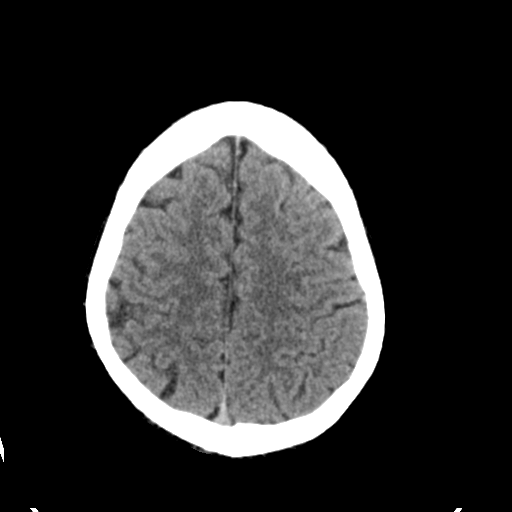
[im 24/32  bone]
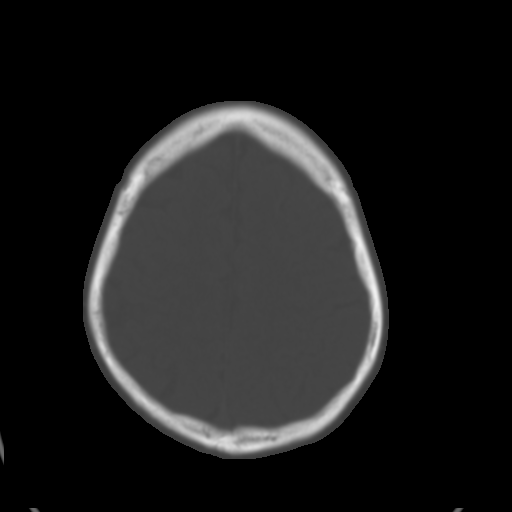
[im 26/32  brain]
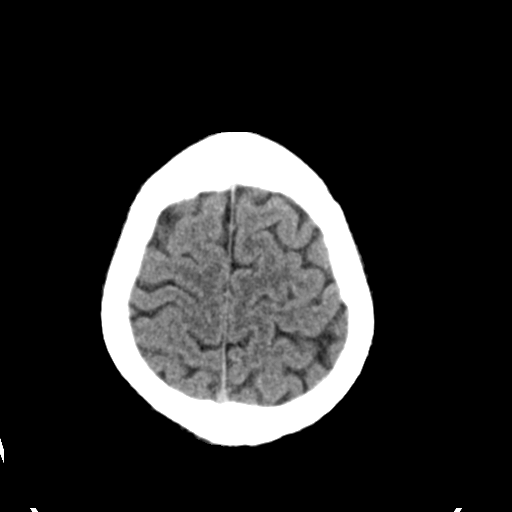
[im 28/32  brain]
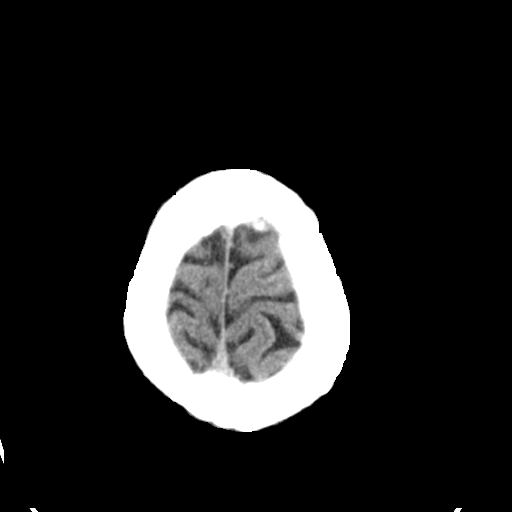
[im 30/32  brain]
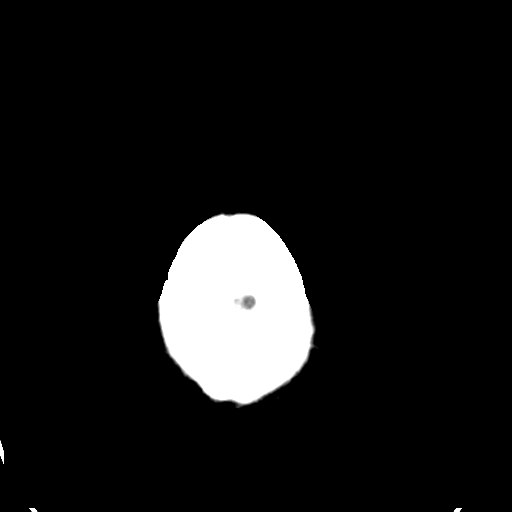

[16 of 30 positions shown; findings below may reference images not displayed]

FINDINGS: Ventricles and sulci are appropriate for patient's age. No evidence
for acute cortically based infarct, intracranial hemorrhage, mass
lesion or mass-effect. The orbits are unremarkable. Paranasal
sinuses are well aerated. Mastoid air cells are unremarkable.
Calvarium is intact.
IMPRESSION: No acute intracranial process.

## 2017-05-01 ENCOUNTER — Telehealth: Payer: Self-pay | Admitting: Family Medicine

## 2017-05-01 NOTE — Telephone Encounter (Signed)
That would be great.

## 2017-05-01 NOTE — Telephone Encounter (Signed)
Patient would like to transfer care from Dr. Caryl NeverBurchette to Dr. Durene CalHunter due to his wife also being a Dr. Durene CalHunter patient already. Out of courtesy for both providers I will await an approval or denial of transfer before contacting the patient. Looking forward to your responses.   Copied from CRM [phone]. Topic: Appointment Scheduling - Scheduling Inquiry for Clinic >> May 01, 2017 10:04 AM Landry MellowFoltz, Melissa J wrote: Reason for CRM: pt would like to transfer providers from Dr Caryl NeverBurchette to Dr Durene CalHunter at [facility]. His wife is a pt of Dr Durene CalHunter.  Please call to set up at [phone]

## 2017-05-01 NOTE — Telephone Encounter (Signed)
Fine with me

## 2017-05-02 NOTE — Telephone Encounter (Signed)
I attempted to call the patient and I left a voicemail advising that the transfer was approved. Awaiting a new patient appointment be scheduled with Dr. Durene CalHunter by the Patient Engagement Center when he calls.

## 2017-06-05 ENCOUNTER — Ambulatory Visit: Payer: Commercial Managed Care - HMO | Admitting: Family Medicine

## 2017-06-05 ENCOUNTER — Encounter: Payer: Self-pay | Admitting: Family Medicine

## 2017-06-05 VITALS — BP 114/62 | HR 69 | Temp 98.4°F | Ht 70.25 in | Wt 168.2 lb

## 2017-06-05 DIAGNOSIS — Z23 Encounter for immunization: Secondary | ICD-10-CM | POA: Diagnosis not present

## 2017-06-05 DIAGNOSIS — Z0000 Encounter for general adult medical examination without abnormal findings: Secondary | ICD-10-CM

## 2017-06-05 DIAGNOSIS — G43009 Migraine without aura, not intractable, without status migrainosus: Secondary | ICD-10-CM

## 2017-06-05 DIAGNOSIS — Z8052 Family history of malignant neoplasm of bladder: Secondary | ICD-10-CM | POA: Diagnosis not present

## 2017-06-05 DIAGNOSIS — Z13220 Encounter for screening for lipoid disorders: Secondary | ICD-10-CM | POA: Diagnosis not present

## 2017-06-05 DIAGNOSIS — G43909 Migraine, unspecified, not intractable, without status migrainosus: Secondary | ICD-10-CM | POA: Insufficient documentation

## 2017-06-05 NOTE — Progress Notes (Signed)
Phone: [phone]  Subjective:  Patient presents today to establish care.  Prior patient of Dr. Caryl Never. Chief complaint-noted.   See problem oriented charting  The following were reviewed and entered/updated in epic: Past Medical History:  Diagnosis Date  . Migraines    sedentary job, staring at screen. 2015 went to urgent care and then ER- CT scan normal.    Patient Active Problem List   Diagnosis Date Noted  . Migraines    Past Surgical History:  Procedure Laterality Date  . MOLE REMOVAL     benign  . WISDOM TOOTH EXTRACTION  10-08-2002    Family History  Problem Relation Age of Onset  . Bladder Cancer Father 59       Deceased. diagnosed 2008-10-07. never smoker  . Hyperlipidemia Father   . Hypertension Father   . Stroke Father        early 4s  . Healthy Mother   . Colon cancer Maternal Grandmother   . Breast cancer Paternal Grandmother   . Melanoma Paternal Grandfather   . Other Maternal Grandfather        Natural causes-Age  . Healthy Brother     Medications- reviewed and updated Current Outpatient Medications  Medication Sig Dispense Refill  . Multiple Vitamin (MULTIVITAMIN) tablet Take 1 tablet by mouth daily.    Marland Kitchen aspirin-acetaminophen-caffeine (EXCEDRIN MIGRAINE) 250-250-65 MG per tablet Take 2 tablets by mouth every 6 (six) hours as needed for headache.    . butalbital-acetaminophen-caffeine (FIORICET, ESGIC) 50-325-40 MG per tablet Take 2 tablets by mouth 2 (two) times daily as needed for headache or migraine.     No current facility-administered medications for this visit.     Allergies-reviewed and updated No Known Allergies  Social History   Socioeconomic History  . Marital status: Married    Spouse name: None  . Number of children: None  . Years of education: None  . Highest education level: None  Social Needs  . Financial resource strain: None  . Food insecurity - worry: None  . Food insecurity - inability: None  . Transportation needs -  medical: None  . Transportation needs - non-medical: None  Occupational History  . None  Tobacco Use  . Smoking status: Never Smoker  . Smokeless tobacco: Never Used  Substance and Sexual Activity  . Alcohol use: No  . Drug use: No  . Sexual activity: Yes  Other Topics Concern  . None  Social History Narrative   Married. 4 people in the home- wife and 2 kids Rayfield Citizen and Denny Peon 4 and almost 2 on 05/2017.       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 Undergrad at [employer]: time gym, time with kids    ROS--Full ROS was completed Review of Systems  Constitutional: Negative for chills and fever.  HENT: Negative for hearing loss and tinnitus.   Eyes: Negative for blurred vision and double vision.  Respiratory: Negative for cough and hemoptysis.   Cardiovascular: Negative for chest pain and palpitations.  Gastrointestinal: Negative for heartburn and nausea.  Genitourinary: Negative for dysuria and urgency.  Musculoskeletal: Negative for myalgias and neck pain.  Skin: Negative for itching and rash.  Neurological: Negative for dizziness and headaches.  Endo/Heme/Allergies: Negative for polydipsia. Does not bruise/bleed easily.  Psychiatric/Behavioral: Negative for substance abuse and suicidal ideas.  Objective: BP 114/62 (BP Location: Left Arm, Patient Position: Sitting, Cuff Size: Large)   Pulse 69   Temp 98.4 F (36.9 C) (Oral)   Ht 5' 10.25" (1.784 m)   Wt 168 lb 3.2 oz (76.3 kg)   SpO2 97%   BMI 23.96 kg/m  Gen: NAD, resting comfortably, muscular build HEENT: Mucous membranes are moist. Oropharynx normal. TM normal. Eyes: sclera and lids normal, PERRLA Neck: no thyromegaly, no cervical lymphadenopathy CV: RRR no murmurs rubs or gallops Lungs: CTAB no crackles, wheeze, rhonchi Abdomen: soft/nontender/nondistended/normal bowel sounds. No  rebound or guarding.  Ext: no edema Skin: warm, dry Neuro: 5/5 strength in upper and lower extremities, normal gait, normal reflexes   Assessment/Plan:  32 y.o. male presenting for annual physical.  Health Maintenance counseling: 1. Anticipatory guidance: Patient counseled regarding regular dental exams-q6 months,  eye exams- no issues does not see , wearing seatbelts.  2. Risk factor reduction:  Advised patient of need for regular exercise (gets 2-3 hours a week in) and diet rich and fruits and vegetables to reduce risk of heart attack and stroke. Healthy weight.  3. Immunizations/screenings/ancillary studies- Tdap. HIV screen discussed- he declines 4. Prostate cancer screening- no family history, start at age 31  5. Colon cancer screening - no family history of early colon cancer and none in first degree relative (did have grandparent with colon cancer), start at age 31-50 6. Skin cancer screening- as needed- last seen 2010. Good about sunscreen. Declines worrisome, changing, growing, skin lesions 7. STD screening not needed- monagmous  8. Testicular cancer screening- does once a month screening exam  Other issues- intermittent migraines- excedrin helps  Discussed could do 1-3 year CPE given overall excellent health   Lab/Order associations: Preventative health care - Plan: Lipid panel, CBC, Comprehensive metabolic panel, POCT Urinalysis Dipstick (Automated)  Migraine without aura and without status migrainosus, not intractable - Plan: CBC, Comprehensive metabolic panel  Family history of bladder cancer - Plan: POCT Urinalysis Dipstick (Automated)  Screening for hyperlipidemia - Plan: Lipid panel  Need for Tdap- Tdap given today  Return precautions advised. Tana ConchStephen Shaw Dobek, MD

## 2017-06-05 NOTE — Addendum Note (Signed)
Addended by: Vicente MalesSOUTHERN HIZER, JAMIE M on: 06/05/2017 03:18 PM   Modules accepted: Orders

## 2017-06-05 NOTE — Patient Instructions (Addendum)
Tdap today (good for 10 years). This will be logged into cone/epic system. We are doing this since we cant nail down when your last shot was.   Schedule a lab visit at the check out desk within 2 weeks. Return for future fasting labs meaning nothing but water after midnight please. Ok to take your medications with water.   You are in excellent shape. Your cholesterol looked great 5 years ago- we will recheck that with labs. I want to do a urine each time I see you for a physical (at least every 2-3 years but happy to see you yearly as well)

## 2017-06-19 ENCOUNTER — Other Ambulatory Visit: Payer: 59

## 2017-07-03 ENCOUNTER — Other Ambulatory Visit (INDEPENDENT_AMBULATORY_CARE_PROVIDER_SITE_OTHER): Payer: 59

## 2017-07-03 DIAGNOSIS — Z13220 Encounter for screening for lipoid disorders: Secondary | ICD-10-CM

## 2017-07-03 DIAGNOSIS — Z8052 Family history of malignant neoplasm of bladder: Secondary | ICD-10-CM

## 2017-07-03 DIAGNOSIS — Z0000 Encounter for general adult medical examination without abnormal findings: Secondary | ICD-10-CM | POA: Diagnosis not present

## 2017-07-03 DIAGNOSIS — G43009 Migraine without aura, not intractable, without status migrainosus: Secondary | ICD-10-CM

## 2017-07-03 LAB — POC URINALSYSI DIPSTICK (AUTOMATED)
BILIRUBIN UA: NEGATIVE
Glucose, UA: NEGATIVE
Ketones, UA: NEGATIVE
LEUKOCYTES UA: NEGATIVE
NITRITE UA: NEGATIVE
PH UA: 5 (ref 5.0–8.0)
PROTEIN UA: NEGATIVE
RBC UA: NEGATIVE
Spec Grav, UA: >=1.03 — AB (ref 1.010–1.025)
UROBILINOGEN UA: 0.2 U/dL

## 2017-07-03 LAB — COMPREHENSIVE METABOLIC PANEL
ALK PHOS: 49 U/L (ref 39–117)
ALT: 14 U/L (ref 0–53)
AST: 15 U/L (ref 0–37)
Albumin: 4.7 g/dL (ref 3.5–5.2)
BUN: 16 mg/dL (ref 6–23)
CO2: 31 mEq/L (ref 19–32)
Calcium: 9.4 mg/dL (ref 8.4–10.5)
Chloride: 103 mEq/L (ref 96–112)
Creatinine, Ser: 1.08 mg/dL (ref 0.40–1.50)
GFR: 85.25 mL/min (ref >60.00)
GLUCOSE: 102 mg/dL — AB (ref 70–99)
Potassium: 4 mEq/L (ref 3.5–5.1)
Sodium: 140 mEq/L (ref 135–145)
TOTAL PROTEIN: 7.3 g/dL (ref 6.0–8.3)
Total Bilirubin: 0.7 mg/dL (ref 0.2–1.2)

## 2017-07-03 LAB — CBC
HEMATOCRIT: 42.6 % (ref 39.0–52.0)
Hemoglobin: 14.7 g/dL (ref 13.0–17.0)
MCHC: 34.5 g/dL (ref 30.0–36.0)
MCV: 84 fl (ref 78.0–100.0)
PLATELETS: 302 10*3/uL (ref 150.0–400.0)
RBC: 5.07 Mil/uL (ref 4.22–5.81)
RDW: 13.2 % (ref 11.5–15.5)
WBC: 5.2 10*3/uL (ref 4.0–10.5)

## 2017-07-03 LAB — LIPID PANEL
CHOL/HDL RATIO: 4
Cholesterol: 147 mg/dL (ref 0–200)
HDL: 41.8 mg/dL (ref >39.00)
LDL Cholesterol: 92 mg/dL (ref 0–99)
NONHDL: 104.91
Triglycerides: 67 mg/dL (ref 0.0–149.0)
VLDL: 13.4 mg/dL (ref 0.0–40.0)

## 2018-05-02 LAB — HEPATIC FUNCTION PANEL
ALT: 13 (ref 10–40)
AST: 20 (ref 14–40)
Alkaline Phosphatase: 61 (ref 25–125)
Bilirubin, Total: 0.7

## 2018-05-02 LAB — BASIC METABOLIC PANEL
BUN: 12 (ref 4–21)
Creatinine: 1.1 (ref 0.6–1.3)
Glucose: 85

## 2018-05-02 LAB — LIPID PANEL
Cholesterol: 167 (ref 0–200)
HDL: 50 (ref 35–70)
LDL Cholesterol: 108
Triglycerides: 42 (ref 40–160)

## 2018-05-02 LAB — HIV ANTIBODY (ROUTINE TESTING W REFLEX): HIV 1&2 Ab, 4th Generation: NEGATIVE

## 2018-05-02 LAB — HEMOGLOBIN A1C: Hemoglobin A1C: 5.5

## 2018-12-28 ENCOUNTER — Other Ambulatory Visit: Payer: Self-pay

## 2018-12-28 ENCOUNTER — Ambulatory Visit (INDEPENDENT_AMBULATORY_CARE_PROVIDER_SITE_OTHER): Payer: 59 | Admitting: Family Medicine

## 2018-12-28 ENCOUNTER — Encounter: Payer: Self-pay | Admitting: Family Medicine

## 2018-12-28 VITALS — BP 102/70 | HR 67 | Temp 98.2°F | Ht 70.25 in | Wt 165.0 lb

## 2018-12-28 DIAGNOSIS — G43009 Migraine without aura, not intractable, without status migrainosus: Secondary | ICD-10-CM | POA: Diagnosis not present

## 2018-12-28 DIAGNOSIS — Z8052 Family history of malignant neoplasm of bladder: Secondary | ICD-10-CM

## 2018-12-28 DIAGNOSIS — Z0000 Encounter for general adult medical examination without abnormal findings: Secondary | ICD-10-CM | POA: Diagnosis not present

## 2018-12-28 NOTE — Progress Notes (Signed)
Phone: [phone]    Subjective:  Patient presents today for their annual physical. Chief complaint-noted.   See problem oriented charting- ROS- full  review of systems was completed and negative including No chest pain or shortness of breath. No fevers or chills or vision changes.   The following were reviewed and entered/updated in epic: Past Medical History:  Diagnosis Date  . Migraines    sedentary job, staring at screen. 2015 went to urgent care and then ER- CT scan normal.    Patient Active Problem List   Diagnosis Date Noted  . Migraines    Past Surgical History:  Procedure Laterality Date  . MOLE REMOVAL     benign  . WISDOM TOOTH EXTRACTION  2004    Family History  Problem Relation Age of Onset  . Bladder Cancer Father 8053       Deceased. diagnosed 2010. never smoker  . Hyperlipidemia Father   . Hypertension Father   . Stroke Father        early 2150s  . Healthy Mother   . Colon cancer Maternal Grandmother   . Breast cancer Paternal Grandmother   . Melanoma Paternal Grandfather   . Other Maternal Grandfather        Natural causes-Age  . Healthy Brother     Medications- reviewed and updated Current Outpatient Medications  Medication Sig Dispense Refill  . aspirin-acetaminophen-caffeine (EXCEDRIN MIGRAINE) 250-250-65 MG per tablet Take 2 tablets by mouth every 6 (six) hours as needed for headache.    . Multiple Vitamin (MULTIVITAMIN) tablet Take 1 tablet by mouth daily.     No current facility-administered medications for this visit.     Allergies-reviewed and updated No Known Allergies  Social History   Social History Narrative   Married. 4 people in the home- wife and 2 kids [place] Citizen(Caroline and Denny Peonvery 4 and almost 2 on 05/2017.       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 Undergrad at [employer]      Hobbies: time gym, time with kids       Objective:  BP 102/70 (BP Location: Left Arm, Patient Position: Sitting, Cuff Size: Normal)   Pulse 67   Temp 98.2 F (36.8 C) (Oral)   Ht 5' 10.25" (1.784 m)   Wt 165 lb (74.8 kg)   SpO2 98%   BMI 23.51 kg/m  Gen: NAD, resting comfortably HEENT: Mucous membranes are moist. Oropharynx normal Neck: no thyromegaly or cervical lymphadenopathy CV: RRR no murmurs rubs or gallops Lungs: CTAB no crackles, wheeze, rhonchi Abdomen: soft/nontender/nondistended/normal bowel sounds. No rebound or guarding.  Ext: no edema and 2+ Pt pulses Skin: warm, dry Neuro: grossly normal, moves all extremities, PERRLA    Assessment and Plan:  33 y.o. male presenting for annual physical.  Health Maintenance counseling: 1. Anticipatory guidance: Patient counseled regarding regular dental exams -q6 months, eye exams - no issues with vision,  avoiding smoking and second hand smoke , limiting alcohol to 2 beverages per day- doesn't drink much.   2. Risk factor reduction:  Advised patient of need for regular exercise and diet rich and fruits and vegetables to reduce risk of heart attack and stroke. Exercise-  not exercising as much since covid. Is walking dog 30 minutes up to 2-3x a week. Diet- reasonably healthy diet.  Wt Readings from Last 3 Encounters:  12/28/18 165 lb (  74.8 kg)  06/05/17 168 lb 3.2 oz (76.3 kg)  03/23/15 176 lb (79.8 kg)  3. Immunizations/screenings/ancillary studies-declines flu shot  Immunization History  Administered Date(s) Administered  . Tdap 06/05/2017  4. Prostate cancer screening-   No family history, start at age 36   5. Colon cancer screening - grandparent with colon cancer in 68s, start at age 68-50  6. Skin cancer screening/prevention-see dermatology as needed. advised regular sunscreen use. Denies worrisome, changing, or new skin lesions.  7. Testicular cancer screening- advised monthly self exams  8. STD screening- patient opts out as monogamous 9.  Never smoker   Status of chronic or acute concerns  Migraines - taking Excedrin Migraine if needed (Fioricet prn in the past was effective). No recent migraines.   Exercise has dropped off as he really gets motivated by the gym- we are writing a letter to allow him to participate at the [employer]  Will be working at home at least through thanksgiving. Getting kids ready in morning to get to sitter- he comes and works at home  Sugar slightly high last visit but not sure if he was fasting  Had labs for life insurance in December including urine- he will send me a copy and I will review and get back to him  Several moles- no recent known change- offered derm- declines for now  Recommended follow up: 1-2 year physical  Lab/Order associations:not fasting    ICD-10-CM   1. Preventative health care  Z00.00   2. Migraine without aura and without status migrainosus, not intractable  G43.009   3. Family history of bladder cancer  Z80.52    Return precautions advised.  Garret Reddish, MD

## 2018-12-28 NOTE — Patient Instructions (Addendum)
Declined flu shot  1-2 year physical  Keep an eye on your moles particularly for anything growing/changing color or shape - consider dermatology visit at some point- happy to refer you before next physical if you want or we can refer at that time   Hopefully the letter will help you get back in the gym- good job with weight maintenance even without getting in the gym

## 2018-12-31 ENCOUNTER — Encounter: Payer: Self-pay | Admitting: Family Medicine

## 2019-10-25 ENCOUNTER — Ambulatory Visit: Payer: 59 | Admitting: Family Medicine

## 2020-10-22 ENCOUNTER — Telehealth: Payer: Self-pay | Admitting: Family Medicine

## 2020-10-24 ENCOUNTER — Other Ambulatory Visit: Payer: Self-pay

## 2020-10-24 ENCOUNTER — Encounter (HOSPITAL_COMMUNITY): Payer: Self-pay

## 2020-10-24 ENCOUNTER — Ambulatory Visit (HOSPITAL_COMMUNITY)
Admission: RE | Admit: 2020-10-24 | Discharge: 2020-10-24 | Disposition: A | Discharge status: Home / Self Care | Payer: 59 | Source: Ambulatory Visit | Attending: Urgent Care | Admitting: Urgent Care

## 2020-10-24 VITALS — BP 114/76 | HR 71 | Temp 98.2°F | Resp 16

## 2020-10-24 DIAGNOSIS — R059 Cough, unspecified: Secondary | ICD-10-CM

## 2020-10-24 DIAGNOSIS — J019 Acute sinusitis, unspecified: Secondary | ICD-10-CM | POA: Diagnosis not present

## 2020-10-24 MED ORDER — PSEUDOEPHEDRINE HCL 60 MG PO TABS: 60.0000 mg | ORAL_TABLET | Freq: Three times a day (TID) | ORAL | 0 refills | Status: DC | PRN

## 2020-10-24 MED ORDER — AMOXICILLIN 875 MG PO TABS: 875.0000 mg | ORAL_TABLET | Freq: Two times a day (BID) | ORAL | 0 refills | Status: DC

## 2020-10-24 MED ORDER — CETIRIZINE HCL 10 MG PO TABS: 10.0000 mg | ORAL_TABLET | Freq: Every day | ORAL | 0 refills | Status: DC

## 2020-10-24 NOTE — ED Provider Notes (Signed)
[facility]   MRN: [id number] DOB: [birth year]  Subjective:   Luke Gallagher is a 35 y.o. male presenting for 2.5-week history of persistent and worsening frontal sinus pain, sinus congestion, sinus pressure, productive cough.  Has been using over-the-counter DayQuil and NyQuil with minimal relief.  Symptoms have gotten much worse in the past 3 days.  Denies chest pain, shortness of breath.  Has taken 2 COVID-19 test and both were negative.  No current facility-administered medications for this encounter.  Current Outpatient Medications:    aspirin-acetaminophen-caffeine (EXCEDRIN MIGRAINE) 250-250-65 MG per tablet, Take 2 tablets by mouth every 6 (six) hours as needed for headache., Disp: , Rfl:    Multiple Vitamin (MULTIVITAMIN) tablet, Take 1 tablet by mouth daily., Disp: , Rfl:    No Known Allergies  Past Medical History:  Diagnosis Date   Migraines    sedentary job, staring at screen. 2015 went to urgent care and then ER- CT scan normal.      Past Surgical History:  Procedure Laterality Date   MOLE REMOVAL     benign   WISDOM TOOTH EXTRACTION  09-25-2002    Family History  Problem Relation Age of Onset   Bladder Cancer Father 24       Deceased. diagnosed 09-24-2008. never smoker   Hyperlipidemia Father    Hypertension Father    Stroke Father        early 107s   Healthy Mother    Colon cancer Maternal Grandmother    Breast cancer Paternal Grandmother    Melanoma Paternal Grandfather    Other Maternal Grandfather        Natural causes-Age   Healthy Brother     Social History   Tobacco Use   Smoking status: Never   Smokeless tobacco: Never  Vaping Use   Vaping Use: Never used  Substance Use Topics   Alcohol use: No   Drug use: No    ROS   Objective:   Vitals: BP 114/76 (BP Location: Left Arm)   Pulse 71   Temp 98.2 F (36.8 C) (Oral)   Resp 16   SpO2 98%   Physical Exam Constitutional:      General: He is not in acute distress.     Appearance: Normal appearance. He is well-developed and normal weight. He is not ill-appearing, toxic-appearing or diaphoretic.  HENT:     Head: Normocephalic and atraumatic.     Right Ear: Tympanic membrane, ear canal and external ear normal. There is no impacted cerumen.     Left Ear: Tympanic membrane, ear canal and external ear normal. There is no impacted cerumen.     Nose: Nose normal. No congestion or rhinorrhea.     Comments: Frontal sinus tenderness.    Mouth/Throat:     Mouth: Mucous membranes are moist.     Pharynx: No oropharyngeal exudate or posterior oropharyngeal erythema.  Eyes:     General: No scleral icterus.       Right eye: No discharge.        Left eye: No discharge.     Extraocular Movements: Extraocular movements intact.     Conjunctiva/sclera: Conjunctivae normal.     Pupils: Pupils are equal, round, and reactive to light.  Cardiovascular:     Rate and Rhythm: Normal rate and regular rhythm.     Heart sounds: Normal heart sounds. No murmur heard.   No friction rub. No gallop.  Pulmonary:     Effort: Pulmonary  effort is normal. No respiratory distress.     Breath sounds: Normal breath sounds. No stridor. No wheezing, rhonchi or rales.  Musculoskeletal:     Cervical back: Normal range of motion and neck supple. No rigidity. No muscular tenderness.  Neurological:     General: No focal deficit present.     Mental Status: He is alert and oriented to person, place, and time.  Psychiatric:        Mood and Affect: Mood normal.        Behavior: Behavior normal.        Thought Content: Thought content normal.     Assessment and Plan :   PDMP not reviewed this encounter.  1. Acute non-recurrent sinusitis, unspecified location   2. Cough     Will start empiric treatment for sinusitis with amoxicillin.  Recommended supportive care otherwise including the use of oral antihistamine, decongestant.  Will defer COVID-19 testing as he already did this twice.   Counseled patient on potential for adverse effects with medications prescribed/recommended today, ER and return-to-clinic precautions discussed, patient verbalized understanding.    Wallis Bamberg, PA-C 10/24/20 1328

## 2020-10-24 NOTE — ED Triage Notes (Signed)
Pt presents with nasal congestion, productive cough, sinus pain and pressure xs 2 weeks. States symptoms subsided with OTC medication but returned within the last 3 days.

## 2021-05-03 ENCOUNTER — Encounter: Payer: Self-pay | Admitting: Physician Assistant

## 2021-05-03 ENCOUNTER — Telehealth (INDEPENDENT_AMBULATORY_CARE_PROVIDER_SITE_OTHER): Payer: 59 | Admitting: Physician Assistant

## 2021-05-03 VITALS — BP 117/78 | HR 88 | Ht 70.25 in

## 2021-05-03 DIAGNOSIS — J0100 Acute maxillary sinusitis, unspecified: Secondary | ICD-10-CM

## 2021-05-03 MED ORDER — AMOXICILLIN-POT CLAVULANATE 875-125 MG PO TABS: 1.0000 | ORAL_TABLET | Freq: Two times a day (BID) | ORAL | 0 refills | Status: AC

## 2021-05-03 MED ORDER — PREDNISONE 20 MG PO TABS: 20.0000 mg | ORAL_TABLET | Freq: Two times a day (BID) | ORAL | 0 refills | Status: AC

## 2021-05-03 NOTE — Progress Notes (Signed)
Virtual Visit via Video Note  I connected with  Luke Gallagher  on 05/03/21 at 11:45 AM EST by a video enabled telemedicine application and verified that I am speaking with the correct person using two identifiers.  Location: Patient: home Provider: Nature conservation officer at [employer] Persons present: Patient and myself   I discussed the limitations of evaluation and management by telemedicine and the availability of in person appointments. The patient expressed understanding and agreed to proceed.   History of Present Illness:  Chief complaint: URI symptoms Symptom onset: Two weeks ago started like a cold, then worse, then better for a few days, worse again last week  Pertinent positives: Facial pain, sinus pressure, congestion, headache, productive cough yellow sputum, chills Pertinent negatives: Fever, N/V/D, CP, SOB  Treatments tried: Mucinex, Dayquil & Nyquil, nasal saline Vaccine status: Did not have flu vaccine, last COVID-19 vaccine about 1 year ago  Sick exposure: Wife and two children recently ill as well, but they are better now   Negative home COVID-19 test    Observations/Objective:   Gen: Awake, alert, no acute distress, points to maxillary sinuses as source of pain, tender with palpation Resp: Breathing is even and non-labored Psych: calm/pleasant demeanor Neuro: Alert and Oriented x 3, + facial symmetry, speech is clear.   Assessment and Plan: 1. Acute maxillary sinusitis, recurrence not specified Persistent symptoms >10 days despite conservative efforts at home. Will Rx Augmentin & prednisone burst at this time, take with food. Cautioned on antibiotic use and possible side effects. Advised nasal saline, humidifier, and pushing fluids. Call if worse or no improvement.    Follow Up Instructions:    I discussed the assessment and treatment plan with the patient. The patient was provided an opportunity to ask questions and all were answered. The patient  agreed with the plan and demonstrated an understanding of the instructions.   The patient was advised to call back or seek an in-person evaluation if the symptoms worsen or if the condition fails to improve as anticipated.  Takumi Din M Libi Corso, PA-C

## 2022-02-07 ENCOUNTER — Encounter: Payer: Self-pay | Admitting: *Deleted

## 2022-04-28 ENCOUNTER — Encounter: Payer: Self-pay | Admitting: *Deleted

## 2024-03-29 ENCOUNTER — Ambulatory Visit (INDEPENDENT_AMBULATORY_CARE_PROVIDER_SITE_OTHER): Admitting: Family Medicine

## 2024-03-29 ENCOUNTER — Encounter: Payer: Self-pay | Admitting: Family Medicine

## 2024-03-29 VITALS — BP 102/72 | HR 65 | Temp 99.1°F | Ht 70.0 in | Wt 157.2 lb

## 2024-03-29 DIAGNOSIS — Z13220 Encounter for screening for lipoid disorders: Secondary | ICD-10-CM

## 2024-03-29 DIAGNOSIS — Z8052 Family history of malignant neoplasm of bladder: Secondary | ICD-10-CM

## 2024-03-29 DIAGNOSIS — Z0000 Encounter for general adult medical examination without abnormal findings: Secondary | ICD-10-CM

## 2024-03-29 DIAGNOSIS — Z131 Encounter for screening for diabetes mellitus: Secondary | ICD-10-CM

## 2024-03-29 DIAGNOSIS — Z130 Encounter for screening for diseases of the blood and blood-forming organs and certain disorders involving the immune mechanism: Secondary | ICD-10-CM

## 2024-03-29 DIAGNOSIS — R739 Hyperglycemia, unspecified: Secondary | ICD-10-CM

## 2024-03-29 NOTE — Patient Instructions (Addendum)
 Please stop by lab before you go If you have mychart- we will send your results within 3 business days of us  receiving them.  If you do not have mychart- we will call you about results within 5 business days of us  receiving them.  *please also note that you will see labs on mychart as soon as they post. I will later go in and write notes on them- will say notes from Dr. Katrinka   Only change before lab recommendations is getting back on your exercise  Recommended follow up: Return in about 1 year (around 03/29/2025) for physical or sooner if needed.Schedule b4 you leave. Or up to 2-2.5 years

## 2024-03-29 NOTE — Progress Notes (Addendum)
 Phone: [phone]    Subjective:  Patient presents today for their annual physical. Chief complaint-noted.   See problem oriented charting- ROS- full  review of systems was completed and negative  except for topics noted under acute/chronic concerns  The following were reviewed and entered/updated in epic: Past Medical History:  Diagnosis Date   Migraines    sedentary job, staring at screen. 2015 went to urgent care and then ER- CT scan normal.    Patient Active Problem List   Diagnosis Date Noted   Migraines    Past Surgical History:  Procedure Laterality Date   MOLE REMOVAL     benign   WISDOM TOOTH EXTRACTION  04/28/2003    Family History  Problem Relation Age of Onset   Healthy Mother    Bladder Cancer Father 11       Deceased. diagnosed 04/27/09. never smoker- exposed to 2nd hand though   Hyperlipidemia Father    Hypertension Father    Stroke Father        early 44s   Healthy Brother    Colon cancer Maternal Grandmother    Other Maternal Grandfather        Natural causes-Age   Breast cancer Paternal Grandmother    Melanoma Paternal Grandfather     Medications- reviewed and updated Current Outpatient Medications  Medication Sig Dispense Refill   aspirin-acetaminophen -caffeine (EXCEDRIN MIGRAINE) 250-250-65 MG per tablet Take 2 tablets by mouth every 6 (six) hours as needed for headache.     Multiple Vitamin (MULTIVITAMIN) tablet Take 1 tablet by mouth daily.     No current facility-administered medications for this visit.    Allergies-reviewed and updated No Known Allergies  Social History   Social History Narrative   Married. 5 people in the home- wife and 3 kids Susette and Valery 4 and almost 2 on 05/2017. 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 [employer] [facility])  from [employer]. Undergrad at [employer]: time gym,  time with kids     Objective:  BP 102/72   Pulse 65   Temp 99.1 F (37.3 C) (Temporal)   Ht 5' 10 (1.778 m)   Wt 157 lb 3.2 oz (71.3 kg)   SpO2 98%   BMI 22.56 kg/m  Gen: NAD, resting comfortably HEENT: Mucous membranes are moist. Oropharynx normal Neck: no thyromegaly CV: RRR no murmurs rubs or gallops Lungs: CTAB no crackles, wheeze, rhonchi Abdomen: soft/nontender/nondistended/normal bowel sounds. No rebound or guarding.  Ext: no edema Skin: warm, dry Neuro: grossly normal, moves all extremities, PERRLA    Assessment and Plan:  38 y.o. male presenting for annual physical.  Health Maintenance counseling: 1. Anticipatory guidance: Patient counseled regarding regular dental exams -q6 months, eye exams - no vision issues,  avoiding smoking and second hand smoke , limiting alcohol to 2 beverages per day- almost never- thinks last drink college, no illicit drugs.   2. Risk factor reduction:  Advised patient of need for regular exercise and diet rich and fruits and vegetables to reduce risk of heart attack and stroke.  Exercise- tough last 2 months with work and family- but previously 2-3 days a week- encouraged to push again for that 2-3 days a week at least in the gym.  Diet/weight management-tries to eat reasonably health .  Wt Readings  from Last 3 Encounters:  03/29/24 157 lb 3.2 oz (71.3 kg)  12/28/18 165 lb (74.8 kg)  06/05/17 168 lb 3.2 oz (76.3 kg)   3. Immunizations/screenings/ancillary studies- up to date- opts out flu and covid Immunization History  Administered Date(s) Administered   Janssen (J&J) SARS-COV-2 Vaccination 01/31/2020   Tdap 06/05/2017  4. Prostate cancer screening- no family history, start at age 69    5. Colon cancer screening - grandparent with colon cancer in 80s-we plan to start at age 8 6. Skin cancer screening/prevention- dermatology as needed in past. advised regular sunscreen use. Denies worrisome, changing, or new skin lesions.  7.  Testicular cancer screening- advised monthly self exams  8. STD screening- patient opts out- only active with [facility] 9. Smoking associated screening- never smoker. Dad with bladder cancer and never smoker.   Status of chronic or acute concerns   # Migraines-Excedrin Migraine in the past. Very sparing  #somewhat sluggish but just with not exercising- wants to restart  Recommended follow up: Return in about 1 year (around 03/29/2025) for physical or sooner if needed.Schedule b4 you leave. Or up to 2-2.5 years  Lab/Order associations: fasting since 8 am other than a couple crackers   ICD-10-CM   1. Preventative health care  Z00.00     2. Screening for hyperlipidemia  Z13.220 Comprehensive metabolic panel with GFR    Lipid panel    3. Screening for deficiency anemia  Z13.0 CBC with Differential/Platelet    4. Family history of bladder cancer  Z80.52 Urinalysis, Routine w reflex microscopic    5. Hyperglycemia  R73.9 Hemoglobin A1c    6. Screening for diabetes mellitus  Z13.1 Hemoglobin A1c      No orders of the defined types were placed in this encounter.   Return precautions advised.   Garnette Lukes, MD

## 2024-03-29 NOTE — Addendum Note (Signed)
 Addended by: KATRINKA GARNETTE KIDD on: 03/29/2024 04:09 PM   Modules accepted: Orders

## 2024-03-29 NOTE — Addendum Note (Signed)
 Addended by: KATRINKA GARNETTE KIDD on: 03/29/2024 06:14 PM   Modules accepted: Level of Service

## 2024-03-30 LAB — URINALYSIS, ROUTINE W REFLEX MICROSCOPIC
Bilirubin Urine: NEGATIVE
Glucose, UA: NEGATIVE
Hgb urine dipstick: NEGATIVE
Ketones, ur: NEGATIVE
Leukocytes,Ua: NEGATIVE
Nitrite: NEGATIVE
Protein, ur: NEGATIVE
Specific Gravity, Urine: 1.012 (ref 1.001–1.035)
pH: 5.5 (ref 5.0–8.0)

## 2024-03-30 LAB — LIPID PANEL
Cholesterol: 177 mg/dL (ref <200)
HDL: 59 mg/dL (ref >=40)
LDL Cholesterol (Calc): 105 mg/dL — ABNORMAL HIGH
Non-HDL Cholesterol (Calc): 118 mg/dL (ref <130)
Total CHOL/HDL Ratio: 3 (calc) (ref <5.0)
Triglycerides: 51 mg/dL (ref <150)

## 2024-03-30 LAB — COMPREHENSIVE METABOLIC PANEL WITH GFR
AG Ratio: 1.9 (calc) (ref 1.0–2.5)
ALT: 11 U/L (ref 9–46)
AST: 17 U/L (ref 10–40)
Albumin: 5.1 g/dL (ref 3.6–5.1)
Alkaline phosphatase (APISO): 52 U/L (ref 36–130)
BUN: 11 mg/dL (ref 7–25)
CO2: 28 mmol/L (ref 20–32)
Calcium: 10.2 mg/dL (ref 8.6–10.3)
Chloride: 101 mmol/L (ref 98–110)
Creat: 1.15 mg/dL (ref 0.60–1.26)
Globulin: 2.7 g/dL (ref 1.9–3.7)
Glucose, Bld: 89 mg/dL (ref 65–99)
Potassium: 4 mmol/L (ref 3.5–5.3)
Sodium: 139 mmol/L (ref 135–146)
Total Bilirubin: 0.8 mg/dL (ref 0.2–1.2)
Total Protein: 7.8 g/dL (ref 6.1–8.1)
eGFR: 85 mL/min/1.73m2 (ref >=60)

## 2024-03-30 LAB — CBC WITH DIFFERENTIAL/PLATELET
Absolute Lymphocytes: 1920 {cells}/uL (ref 850–3900)
Absolute Monocytes: 512 {cells}/uL (ref 200–950)
Basophils Absolute: 40 {cells}/uL (ref 0–200)
Basophils Relative: 0.5 %
Eosinophils Absolute: 40 {cells}/uL (ref 15–500)
Eosinophils Relative: 0.5 %
HCT: 45.1 % (ref 38.5–50.0)
Hemoglobin: 15.3 g/dL (ref 13.2–17.1)
MCH: 29.5 pg (ref 27.0–33.0)
MCHC: 33.9 g/dL (ref 32.0–36.0)
MCV: 86.9 fL (ref 80.0–100.0)
MPV: 9.3 fL (ref 7.5–12.5)
Monocytes Relative: 6.4 %
Neutro Abs: 5488 {cells}/uL (ref 1500–7800)
Neutrophils Relative %: 68.6 %
Platelets: 308 Thousand/uL (ref 140–400)
RBC: 5.19 Million/uL (ref 4.20–5.80)
RDW: 12.4 % (ref 11.0–15.0)
Total Lymphocyte: 24 %
WBC: 8 Thousand/uL (ref 3.8–10.8)

## 2024-03-30 LAB — HEMOGLOBIN A1C
Hgb A1c MFr Bld: 5.6 % (ref <5.7)
Mean Plasma Glucose: 114 mg/dL
eAG (mmol/L): 6.3 mmol/L

## 2024-04-01 ENCOUNTER — Ambulatory Visit: Payer: Self-pay | Admitting: Family Medicine

## 2025-04-01 ENCOUNTER — Encounter: Admitting: Family Medicine
